# Patient Record
Sex: Male | Born: 1975 | Hispanic: Yes | Marital: Married
Health system: Southern US, Community
[De-identification: ages and names within clinical notes are randomized; demographics above are authoritative.]

---

## 2022-03-11 ENCOUNTER — Emergency Department (HOSPITAL_COMMUNITY): Payer: Self-pay | Admitting: Anesthesiology

## 2022-03-11 ENCOUNTER — Other Ambulatory Visit: Payer: Self-pay

## 2022-03-11 ENCOUNTER — Emergency Department (HOSPITAL_COMMUNITY): Payer: Self-pay

## 2022-03-11 ENCOUNTER — Encounter (HOSPITAL_COMMUNITY)
Admission: EM | Disposition: A | Discharge status: Home / Self Care | Payer: Self-pay | Source: Home / Self Care | Attending: Student

## 2022-03-11 ENCOUNTER — Emergency Department (EMERGENCY_DEPARTMENT_HOSPITAL): Payer: Self-pay | Admitting: Anesthesiology

## 2022-03-11 ENCOUNTER — Encounter (HOSPITAL_COMMUNITY): Payer: Self-pay | Admitting: Anesthesiology

## 2022-03-11 ENCOUNTER — Ambulatory Visit (HOSPITAL_COMMUNITY)
Admission: EM | Admit: 2022-03-11 | Discharge: 2022-03-12 | Disposition: A | Discharge status: Home / Self Care | Payer: Self-pay | Attending: Student | Admitting: Student

## 2022-03-11 DIAGNOSIS — S52222C Displaced transverse fracture of shaft of left ulna, initial encounter for open fracture type IIIA, IIIB, or IIIC: Secondary | ICD-10-CM | POA: Diagnosis present

## 2022-03-11 DIAGNOSIS — S52202A Unspecified fracture of shaft of left ulna, initial encounter for closed fracture: Secondary | ICD-10-CM

## 2022-03-11 DIAGNOSIS — S52202B Unspecified fracture of shaft of left ulna, initial encounter for open fracture type I or II: Secondary | ICD-10-CM

## 2022-03-11 HISTORY — PX: ORIF ULNAR FRACTURE: SHX5417

## 2022-03-11 SURGERY — OPEN REDUCTION INTERNAL FIXATION (ORIF) ULNAR FRACTURE
Anesthesia: General | Site: Arm Upper | Laterality: Left

## 2022-03-11 MED ORDER — OXYCODONE HCL 5 MG PO TABS: 5.0000 mg | ORAL_TABLET | Freq: Once | ORAL | Status: DC | PRN

## 2022-03-11 MED ORDER — MORPHINE SULFATE (PF) 4 MG/ML IV SOLN
4.0000 mg | Freq: Once | INTRAVENOUS | Status: AC
  Administered 2022-03-11: 4 mg via INTRAVENOUS
  Filled 2022-03-11: qty 1

## 2022-03-11 MED ORDER — FENTANYL CITRATE (PF) 250 MCG/5ML IJ SOLN
INTRAMUSCULAR | Status: AC
  Filled 2022-03-11: qty 5

## 2022-03-11 MED ORDER — MIDAZOLAM HCL 5 MG/5ML IJ SOLN
INTRAMUSCULAR | Status: DC | PRN
  Administered 2022-03-11: 2 mg via INTRAVENOUS

## 2022-03-11 MED ORDER — HYDROMORPHONE HCL 1 MG/ML IJ SOLN
INTRAMUSCULAR | Status: AC
  Filled 2022-03-11: qty 1

## 2022-03-11 MED ORDER — OXYCODONE HCL 5 MG PO TABS
5.0000 mg | ORAL_TABLET | Freq: Four times a day (QID) | ORAL | 0 refills | Status: AC | PRN
Start: 2022-03-11 — End: ?

## 2022-03-11 MED ORDER — FENTANYL CITRATE (PF) 100 MCG/2ML IJ SOLN
INTRAMUSCULAR | Status: DC | PRN
Start: 2022-03-11 — End: 2022-03-11
  Administered 2022-03-11: 50 ug via INTRAVENOUS
  Administered 2022-03-11: 100 ug via INTRAVENOUS
  Administered 2022-03-11 (×4): 50 ug via INTRAVENOUS

## 2022-03-11 MED ORDER — CEFAZOLIN SODIUM-DEXTROSE 2-3 GM-%(50ML) IV SOLR
INTRAVENOUS | Status: DC | PRN
  Administered 2022-03-11: 2 g via INTRAVENOUS

## 2022-03-11 MED ORDER — LACTATED RINGERS IV SOLN: INTRAVENOUS | Status: DC | PRN

## 2022-03-11 MED ORDER — PROPOFOL 10 MG/ML IV BOLUS
INTRAVENOUS | Status: AC
  Filled 2022-03-11: qty 20

## 2022-03-11 MED ORDER — ONDANSETRON HCL 4 MG/2ML IJ SOLN
4.0000 mg | Freq: Once | INTRAMUSCULAR | Status: AC
  Administered 2022-03-11: 4 mg via INTRAVENOUS
  Filled 2022-03-11: qty 2

## 2022-03-11 MED ORDER — ROCURONIUM BROMIDE 100 MG/10ML IV SOLN
INTRAVENOUS | Status: DC | PRN
  Administered 2022-03-11: 40 mg via INTRAVENOUS

## 2022-03-11 MED ORDER — SUGAMMADEX SODIUM 200 MG/2ML IV SOLN
INTRAVENOUS | Status: DC | PRN
  Administered 2022-03-11: 200 mg via INTRAVENOUS

## 2022-03-11 MED ORDER — PROPOFOL 10 MG/ML IV BOLUS
INTRAVENOUS | Status: DC | PRN
  Administered 2022-03-11: 120 mg via INTRAVENOUS
  Administered 2022-03-11: 50 mg via INTRAVENOUS

## 2022-03-11 MED ORDER — MELOXICAM 15 MG PO TABS: 15.0000 mg | ORAL_TABLET | Freq: Every day | ORAL | 0 refills | Status: AC | PRN

## 2022-03-11 MED ORDER — ACETAMINOPHEN 500 MG PO TABS: 1000.0000 mg | ORAL_TABLET | Freq: Four times a day (QID) | ORAL | 0 refills | Status: AC | PRN

## 2022-03-11 MED ORDER — ACETAMINOPHEN 10 MG/ML IV SOLN
INTRAVENOUS | Status: AC
  Filled 2022-03-11: qty 100

## 2022-03-11 MED ORDER — DEXAMETHASONE SODIUM PHOSPHATE 4 MG/ML IJ SOLN
INTRAMUSCULAR | Status: DC | PRN
  Administered 2022-03-11: 10 mg via INTRAVENOUS

## 2022-03-11 MED ORDER — ERYTHROMYCIN 5 MG/GM OP OINT
1.0000 "application " | TOPICAL_OINTMENT | Freq: Once | OPHTHALMIC | Status: AC
  Administered 2022-03-11: 1 via OPHTHALMIC
  Filled 2022-03-11: qty 3.5

## 2022-03-11 MED ORDER — SUCCINYLCHOLINE CHLORIDE 200 MG/10ML IV SOSY
PREFILLED_SYRINGE | INTRAVENOUS | Status: DC | PRN
  Administered 2022-03-11: 80 mg via INTRAVENOUS

## 2022-03-11 MED ORDER — ONDANSETRON HCL 4 MG/2ML IJ SOLN
INTRAMUSCULAR | Status: DC | PRN
  Administered 2022-03-11: 4 mg via INTRAVENOUS

## 2022-03-11 MED ORDER — ACETAMINOPHEN 10 MG/ML IV SOLN
INTRAVENOUS | Status: DC | PRN
Start: 2022-03-11 — End: 2022-03-11
  Administered 2022-03-11: 1000 mg via INTRAVENOUS

## 2022-03-11 MED ORDER — LIDOCAINE HCL (CARDIAC) PF 50 MG/5ML IV SOSY
PREFILLED_SYRINGE | INTRAVENOUS | Status: DC | PRN
  Administered 2022-03-11: 60 mg via INTRAVENOUS

## 2022-03-11 MED ORDER — 0.9 % SODIUM CHLORIDE (POUR BTL) OPTIME
TOPICAL | Status: DC | PRN
  Administered 2022-03-11: 1000 mL

## 2022-03-11 MED ORDER — OXYCODONE HCL 5 MG/5ML PO SOLN: 5.0000 mg | Freq: Once | ORAL | Status: DC | PRN

## 2022-03-11 MED ORDER — MEPERIDINE HCL 25 MG/ML IJ SOLN: 6.2500 mg | INTRAMUSCULAR | Status: DC | PRN

## 2022-03-11 MED ORDER — AMISULPRIDE (ANTIEMETIC) 5 MG/2ML IV SOLN: 10.0000 mg | Freq: Once | INTRAVENOUS | Status: DC | PRN

## 2022-03-11 MED ORDER — HYDROMORPHONE HCL 1 MG/ML IJ SOLN
0.2500 mg | INTRAMUSCULAR | Status: DC | PRN
  Administered 2022-03-11 (×4): 0.5 mg via INTRAVENOUS

## 2022-03-11 MED ORDER — ASPIRIN 81 MG PO TBEC: 81.0000 mg | DELAYED_RELEASE_TABLET | Freq: Two times a day (BID) | ORAL | 0 refills | Status: AC

## 2022-03-11 MED ORDER — BUPIVACAINE HCL (PF) 0.25 % IJ SOLN
INTRAMUSCULAR | Status: AC
  Filled 2022-03-11: qty 30

## 2022-03-11 MED ORDER — MIDAZOLAM HCL 2 MG/2ML IJ SOLN
INTRAMUSCULAR | Status: AC
  Filled 2022-03-11: qty 2

## 2022-03-11 MED ORDER — ARTIFICIAL TEARS OPHTHALMIC OINT
TOPICAL_OINTMENT | OPHTHALMIC | Status: DC | PRN
  Administered 2022-03-11: 1 via OPHTHALMIC

## 2022-03-11 MED ORDER — ONDANSETRON 4 MG PO TBDP: 4.0000 mg | ORAL_TABLET | Freq: Two times a day (BID) | ORAL | 0 refills | Status: AC | PRN

## 2022-03-11 MED ORDER — CEFAZOLIN SODIUM-DEXTROSE 1-4 GM/50ML-% IV SOLN
1.0000 g | Freq: Once | INTRAVENOUS | Status: AC
  Administered 2022-03-11: 1 g via INTRAVENOUS
  Filled 2022-03-11: qty 50

## 2022-03-11 MED ORDER — KETOROLAC TROMETHAMINE 30 MG/ML IJ SOLN
INTRAMUSCULAR | Status: DC | PRN
  Administered 2022-03-11: 30 mg via INTRAVENOUS

## 2022-03-11 SURGICAL SUPPLY — 67 items
BAG COUNTER SPONGE SURGICOUNT (BAG) ×2 IMPLANT
BENZOIN TINCTURE PRP APPL 2/3 (GAUZE/BANDAGES/DRESSINGS) ×2 IMPLANT
BIT DRILL 2.6 (BIT) ×2
BIT DRILL 2.6X VARIAX 2 (BIT) IMPLANT
BIT DRL 2.6X VARIAX 2 (BIT) ×1
BLADE CLIPPER SURG (BLADE) IMPLANT
BNDG COHESIVE 4X5 TAN STRL (GAUZE/BANDAGES/DRESSINGS) ×2 IMPLANT
BNDG ELASTIC 3X5.8 VLCR STR LF (GAUZE/BANDAGES/DRESSINGS) ×2 IMPLANT
BNDG ELASTIC 4X5.8 VLCR STR LF (GAUZE/BANDAGES/DRESSINGS) ×2 IMPLANT
BNDG ESMARK 4X9 LF (GAUZE/BANDAGES/DRESSINGS) ×2 IMPLANT
CORD BIPOLAR FORCEPS 12FT (ELECTRODE) ×2 IMPLANT
COVER SURGICAL LIGHT HANDLE (MISCELLANEOUS) ×4 IMPLANT
CUFF TOURN SGL QUICK 18X4 (TOURNIQUET CUFF) ×2 IMPLANT
CUFF TOURN SGL QUICK 24 (TOURNIQUET CUFF)
CUFF TRNQT CYL 24X4X16.5-23 (TOURNIQUET CUFF) IMPLANT
DECANTER SPIKE VIAL GLASS SM (MISCELLANEOUS) IMPLANT
DRAPE OEC MINIVIEW 54X84 (DRAPES) IMPLANT
DRAPE U-SHAPE 47X51 STRL (DRAPES) ×2 IMPLANT
DRSG ADAPTIC 3X8 NADH LF (GAUZE/BANDAGES/DRESSINGS) ×1 IMPLANT
ELECT REM PT RETURN 9FT ADLT (ELECTROSURGICAL)
ELECTRODE REM PT RTRN 9FT ADLT (ELECTROSURGICAL) IMPLANT
GAUZE SPONGE 4X4 12PLY STRL (GAUZE/BANDAGES/DRESSINGS) ×2 IMPLANT
GAUZE SPONGE 4X4 12PLY STRL LF (GAUZE/BANDAGES/DRESSINGS) ×1 IMPLANT
GAUZE XEROFORM 1X8 LF (GAUZE/BANDAGES/DRESSINGS) ×2 IMPLANT
GLOVE BIO SURGEON STRL SZ7.5 (GLOVE) ×2 IMPLANT
GLOVE BIOGEL PI IND STRL 7.5 (GLOVE) ×1 IMPLANT
GLOVE BIOGEL PI IND STRL 8 (GLOVE) ×1 IMPLANT
GLOVE BIOGEL PI INDICATOR 7.5 (GLOVE) ×1
GLOVE BIOGEL PI INDICATOR 8 (GLOVE) ×1
GLOVE SURG SYN 7.5  E (GLOVE) ×2
GLOVE SURG SYN 7.5 E (GLOVE) ×1 IMPLANT
GLOVE SURG SYN 7.5 PF PI (GLOVE) ×1 IMPLANT
GOWN STRL REUS W/ TWL LRG LVL3 (GOWN DISPOSABLE) ×1 IMPLANT
GOWN STRL REUS W/ TWL XL LVL3 (GOWN DISPOSABLE) ×2 IMPLANT
GOWN STRL REUS W/TWL LRG LVL3 (GOWN DISPOSABLE) ×2
GOWN STRL REUS W/TWL XL LVL3 (GOWN DISPOSABLE) ×4
KIT BASIN OR (CUSTOM PROCEDURE TRAY) ×2 IMPLANT
KIT TURNOVER KIT B (KITS) ×2 IMPLANT
MANIFOLD NEPTUNE II (INSTRUMENTS) ×2 IMPLANT
NDL HYPO 25GX1X1/2 BEV (NEEDLE) IMPLANT
NEEDLE HYPO 25GX1X1/2 BEV (NEEDLE) IMPLANT
NS IRRIG 1000ML POUR BTL (IV SOLUTION) ×4 IMPLANT
PACK ORTHO EXTREMITY (CUSTOM PROCEDURE TRAY) ×2 IMPLANT
PAD ARMBOARD 7.5X6 YLW CONV (MISCELLANEOUS) ×4 IMPLANT
PAD CAST 4YDX4 CTTN HI CHSV (CAST SUPPLIES) ×1 IMPLANT
PADDING CAST COTTON 4X4 STRL (CAST SUPPLIES) ×2
PADDING CAST SYNTHETIC 4 (CAST SUPPLIES) ×2
PADDING CAST SYNTHETIC 4X4 STR (CAST SUPPLIES) IMPLANT
PLATE COMP NARROW STRT 6H 78MM (Plate) ×1 IMPLANT
SCREW BN T10 FT 14X3.5XSTRDR (Screw) IMPLANT
SCREW BONE 18X3.5 (Screw) ×1 IMPLANT
SCREW BONE 3.5X14MM (Screw) ×6 IMPLANT
SCREW BONE 3.5X16MM (Screw) ×2 IMPLANT
SLING ARM FOAM STRAP SML (SOFTGOODS) ×1 IMPLANT
SPLINT PLASTER EXTRA FAST 3X15 (CAST SUPPLIES) ×1
SPLINT PLASTER GYPS XFAST 3X15 (CAST SUPPLIES) IMPLANT
SPONGE T-LAP 4X18 ~~LOC~~+RFID (SPONGE) ×2 IMPLANT
STRIP CLOSURE SKIN 1/2X4 (GAUZE/BANDAGES/DRESSINGS) ×2 IMPLANT
SUT ETHILON 3 0 PS 1 (SUTURE) ×2 IMPLANT
SUT MNCRL AB 4-0 PS2 18 (SUTURE) ×2 IMPLANT
SYR CONTROL 10ML LL (SYRINGE) IMPLANT
TOWEL GREEN STERILE (TOWEL DISPOSABLE) ×2 IMPLANT
TOWEL GREEN STERILE FF (TOWEL DISPOSABLE) ×2 IMPLANT
TOWEL OR NON WOVEN STRL DISP B (DISPOSABLE) ×2 IMPLANT
TUBE CONNECTING 12X1/4 (SUCTIONS) ×2 IMPLANT
WATER STERILE IRR 1000ML POUR (IV SOLUTION) ×4 IMPLANT
YANKAUER SUCT BULB TIP NO VENT (SUCTIONS) IMPLANT

## 2022-03-11 NOTE — Interval H&P Note (Signed)
History and Physical Interval Note:  03/11/2022 8:15 PM  Benjamin Hartman  has presented today for surgery, with the diagnosis of Open Fracture, Left .  The various methods of treatment have been discussed with the patient and family. After consideration of risks, benefits and other options for treatment, the patient has consented to  Procedure(s): OPEN REDUCTION INTERNAL FIXATION (ORIF) ULNAR FRACTURE (Left) as a surgical intervention.  The patient's history has been reviewed, patient examined, no change in status, stable for surgery.  I have reviewed the patient's chart and labs.  Questions were answered to the patient's satisfaction.     Sheral Apley

## 2022-03-11 NOTE — Discharge Instructions (Addendum)

## 2022-03-11 NOTE — ED Notes (Signed)
RN assisted pt w/ eye wash station, pt reported relief w/ intervention

## 2022-03-11 NOTE — ED Notes (Signed)
Patient transported to CT 

## 2022-03-11 NOTE — Anesthesia Preprocedure Evaluation (Addendum)
Anesthesia Evaluation  Patient identified by MRN, date of birth, ID band Patient awake    Reviewed: Allergy & Precautions, NPO status , Patient's Chart, lab work & pertinent test results, Unable to perform ROS - Chart review only  Airway Mallampati: II  TM Distance: >3 FB Neck ROM: Full    Dental  (+) Teeth Intact, Dental Advisory Given   Pulmonary neg pulmonary ROS,    breath sounds clear to auscultation       Cardiovascular negative cardio ROS   Rhythm:Regular Rate:Normal     Neuro/Psych negative neurological ROS     GI/Hepatic negative GI ROS, Neg liver ROS,   Endo/Other  negative endocrine ROS  Renal/GU negative Renal ROS     Musculoskeletal negative musculoskeletal ROS (+)   Abdominal   Peds  Hematology negative hematology ROS (+)   Anesthesia Other Findings   Reproductive/Obstetrics                          Anesthesia Physical Anesthesia Plan  ASA: 2 and emergent  Anesthesia Plan: General   Post-op Pain Management: Ofirmev IV (intra-op)* and Toradol IV (intra-op)*   Induction: Intravenous  PONV Risk Score and Plan: 3 and Ondansetron, Dexamethasone, Treatment may vary due to age or medical condition and Midazolam  Airway Management Planned: Oral ETT  Additional Equipment:   Intra-op Plan:   Post-operative Plan: Extubation in OR  Informed Consent: I have reviewed the patients History and Physical, chart, labs and discussed the procedure including the risks, benefits and alternatives for the proposed anesthesia with the patient or authorized representative who has indicated his/her understanding and acceptance.     Dental advisory given  Plan Discussed with: CRNA  Anesthesia Plan Comments:        Anesthesia Quick Evaluation

## 2022-03-11 NOTE — Progress Notes (Signed)

## 2022-03-11 NOTE — Anesthesia Procedure Notes (Signed)
Procedure Name: Intubation Date/Time: 03/11/2022 8:45 PM Performed by: Suzy Bouchard, CRNA Pre-anesthesia Checklist: Patient identified, Emergency Drugs available, Patient being monitored, Suction available and Timeout performed Patient Re-evaluated:Patient Re-evaluated prior to induction Oxygen Delivery Method: Circle system utilized Preoxygenation: Pre-oxygenation with 100% oxygen Induction Type: IV induction and Rapid sequence Laryngoscope Size: Miller and 2 Grade View: Grade I Tube type: Oral Tube size: 7.5 mm Number of attempts: 1 Airway Equipment and Method: Stylet Placement Confirmation: ETT inserted through vocal cords under direct vision, positive ETCO2 and breath sounds checked- equal and bilateral Secured at: 23 cm Tube secured with: Tape Dental Injury: Teeth and Oropharynx as per pre-operative assessment  Comments: Patient has existing ?bite to inner lower lip, appreciated on intubation.
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
## 2022-03-11 NOTE — Transfer of Care (Signed)
Immediate Anesthesia Transfer of Care Note  Patient: Benjamin Hartman  Procedure(s) Performed: OPEN REDUCTION INTERNAL FIXATION (ORIF) ULNAR FRACTURE (Left: Arm Upper)  Patient Location: PACU  Anesthesia Type:General  Level of Consciousness: awake, alert  and oriented  Airway & Oxygen Therapy: Patient Spontanous Breathing and Patient connected to nasal cannula oxygen  Post-op Assessment: Report given to RN and Post -op Vital signs reviewed and stable  Post vital signs: Reviewed and stable  Last Vitals:  Vitals Value Taken Time  BP 121/81 03/11/22 2221  Temp    Pulse 92 03/11/22 2226  Resp 16 03/11/22 2226  SpO2 98 % 03/11/22 2226  Vitals shown include unvalidated device data.  Last Pain:  Vitals:   03/11/22 1659  TempSrc: Oral  PainSc: 6          Complications: No notable events documented.
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
## 2022-03-12 ENCOUNTER — Encounter (HOSPITAL_COMMUNITY): Payer: Self-pay | Admitting: Orthopedic Surgery

## 2022-03-12 NOTE — Op Note (Signed)
03/11/2022  10:19 AM  PATIENT:  Benjamin Hartman    PRE-OPERATIVE DIAGNOSIS:  Open Fracture, Left   POST-OPERATIVE DIAGNOSIS:  Same  PROCEDURE:  OPEN REDUCTION INTERNAL FIXATION (ORIF) ULNAR FRACTURE  SURGEON:  Sheral Apley, MD  ASSISTANT: Levester Fresh, PA-C, he was present and scrubbed throughout the case, critical for completion in a timely fashion, and for retraction, instrumentation, and closure.   ANESTHESIA:   gen  PREOPERATIVE INDICATIONS:  Benjamin Hartman is a  46 y.o. male with a diagnosis of Open Fracture, Left  who failed conservative measures and elected for surgical management.    The risks benefits and alternatives were discussed with the patient preoperatively including but not limited to the risks of infection, bleeding, nerve injury, cardiopulmonary complications, the need for revision surgery, among others, and the patient was willing to proceed.  OPERATIVE IMPLANTS: stryker plate  OPERATIVE FINDINGS: FB in arm, unstable ulna, 2 separate lacerations of 9and 8cm  BLOOD LOSS: min  COMPLICATIONS: none  TOURNIQUET TIME: 45  OPERATIVE PROCEDURE:  Patient was identified in the preoperative holding area and site was marked by me He was transported to the operating theater and placed on the table in supine position taking care to pad all bony prominences. After a preincinduction time out anesthesia was induced. The left upper extremity was prepped and draped in normal sterile fashion and a pre-incision timeout was performed. He received ancef for preoperative antibiotics.   I explored his wound on the left forearm it was at the level of this fracture unclear if there was complete penetration down to the level of the fracture site elected to treated as an open fracture  First I removed 2 large pieces of glass from his deep forearm.  I then incised the periosteum at the level of his fracture and identified his fracture site I debrided this of devitalized  bone  I performed a thorough irrigation at his fracture site  I then reduced and placed a 6-hole plate with bicortical screws on both sides of the fracture  I took multiple x-rays and was happy with the fracture reduction and hardware alignment  I performed a thorough irrigation I then performed a complex closure of one 9 cm wound and then an 8 cm wound  Sterile dressing and splint were applied he was taken to the PACU in stable condition  POST OPERATIVE PLAN: sling and splint, mobilize for dvt px.

## 2022-03-13 NOTE — Anesthesia Postprocedure Evaluation (Signed)
Anesthesia Post Note  Patient: Benjamin Hartman  Procedure(s) Performed: OPEN REDUCTION INTERNAL FIXATION (ORIF) ULNAR FRACTURE (Left: Arm Upper)     Patient location during evaluation: PACU Anesthesia Type: General Level of consciousness: sedated and patient cooperative Pain management: pain level controlled Vital Signs Assessment: post-procedure vital signs reviewed and stable Respiratory status: spontaneous breathing Cardiovascular status: stable Anesthetic complications: no   No notable events documented.  Last Vitals:  Vitals:   03/11/22 2345 03/11/22 2350  BP:  105/70  Pulse: 70 68  Resp: 11 11  Temp:  36.6 C  SpO2: 94% 94%    Last Pain:  Vitals:   03/11/22 2350  TempSrc:   PainSc: [place]
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
